# Patient Record
Sex: Female | Born: 1965 | Race: Black or African American | Hispanic: No | Smoking: Former smoker
Health system: Southern US, Community
[De-identification: ages and names within clinical notes are randomized; demographics above are authoritative.]

## PROBLEM LIST (undated history)

## (undated) DIAGNOSIS — I10 Essential (primary) hypertension: Secondary | ICD-10-CM

## (undated) DIAGNOSIS — C50919 Malignant neoplasm of unspecified site of unspecified female breast: Secondary | ICD-10-CM

## (undated) DIAGNOSIS — E119 Type 2 diabetes mellitus without complications: Secondary | ICD-10-CM

## (undated) HISTORY — PX: TONSILLECTOMY: SUR1361

## (undated) HISTORY — PX: TUBAL LIGATION: SHX77

---

## 2013-12-13 ENCOUNTER — Emergency Department (HOSPITAL_BASED_OUTPATIENT_CLINIC_OR_DEPARTMENT_OTHER): Payer: No Typology Code available for payment source

## 2013-12-13 ENCOUNTER — Emergency Department (HOSPITAL_BASED_OUTPATIENT_CLINIC_OR_DEPARTMENT_OTHER)
Admission: EM | Admit: 2013-12-13 | Discharge: 2013-12-13 | Disposition: A | Discharge status: Home / Self Care | Payer: No Typology Code available for payment source | Attending: Emergency Medicine | Admitting: Emergency Medicine

## 2013-12-13 ENCOUNTER — Encounter (HOSPITAL_BASED_OUTPATIENT_CLINIC_OR_DEPARTMENT_OTHER): Payer: Self-pay | Admitting: Emergency Medicine

## 2013-12-13 DIAGNOSIS — Z4800 Encounter for change or removal of nonsurgical wound dressing: Secondary | ICD-10-CM | POA: Diagnosis not present

## 2013-12-13 DIAGNOSIS — Z880 Allergy status to penicillin: Secondary | ICD-10-CM | POA: Insufficient documentation

## 2013-12-13 DIAGNOSIS — S81801A Unspecified open wound, right lower leg, initial encounter: Secondary | ICD-10-CM

## 2013-12-13 LAB — BASIC METABOLIC PANEL
Anion gap: 14 (ref 5–15)
BUN: 16 mg/dL (ref 6–23)
CO2: 23 meq/L (ref 19–32)
CREATININE: 0.7 mg/dL (ref 0.50–1.10)
Calcium: 9.7 mg/dL (ref 8.4–10.5)
Chloride: 100 mEq/L (ref 96–112)
GFR calc Af Amer: >90 mL/min (ref >90)
GLUCOSE: 107 mg/dL — AB (ref 70–99)
Potassium: 4.2 mEq/L (ref 3.7–5.3)
Sodium: 137 mEq/L (ref 137–147)

## 2013-12-13 LAB — CBC WITH DIFFERENTIAL/PLATELET
Basophils Absolute: 0 10*3/uL (ref 0.0–0.1)
Basophils Relative: 0 % (ref 0–1)
EOS ABS: 0.2 10*3/uL (ref 0.0–0.7)
Eosinophils Relative: 2 % (ref 0–5)
HEMATOCRIT: 39.1 % (ref 36.0–46.0)
Hemoglobin: 12.8 g/dL (ref 12.0–15.0)
LYMPHS PCT: 32 % (ref 12–46)
Lymphs Abs: 2.8 10*3/uL (ref 0.7–4.0)
MCH: 25.7 pg — AB (ref 26.0–34.0)
MCHC: 32.7 g/dL (ref 30.0–36.0)
MCV: 78.5 fL (ref 78.0–100.0)
MONO ABS: 0.9 10*3/uL (ref 0.1–1.0)
Monocytes Relative: 11 % (ref 3–12)
Neutro Abs: 4.9 10*3/uL (ref 1.7–7.7)
Neutrophils Relative %: 55 % (ref 43–77)
Platelets: 364 10*3/uL (ref 150–400)
RBC: 4.98 MIL/uL (ref 3.87–5.11)
RDW: 16 % — AB (ref 11.5–15.5)
WBC: 8.8 10*3/uL (ref 4.0–10.5)

## 2013-12-13 MED ORDER — CLINDAMYCIN PHOSPHATE 900 MG/50ML IV SOLN
900.0000 mg | Freq: Once | INTRAVENOUS | Status: AC
  Administered 2013-12-13: 900 mg via INTRAVENOUS
  Filled 2013-12-13: qty 50

## 2013-12-13 MED ORDER — HYDROMORPHONE HCL PF 1 MG/ML IJ SOLN
1.0000 mg | Freq: Once | INTRAMUSCULAR | Status: AC
  Administered 2013-12-13: 1 mg via INTRAVENOUS
  Filled 2013-12-13: qty 1

## 2013-12-13 MED ORDER — OXYCODONE-ACETAMINOPHEN 5-325 MG PO TABS: 1.0000 | ORAL_TABLET | ORAL | Status: AC | PRN

## 2013-12-13 MED ORDER — SODIUM CHLORIDE 0.9 % IV SOLN
Freq: Once | INTRAVENOUS | Status: AC
  Administered 2013-12-13: 10 mL/h via INTRAVENOUS

## 2013-12-13 NOTE — Discharge Instructions (Signed)

## 2013-12-13 NOTE — ED Provider Notes (Signed)
Medical screening examination/treatment/procedure(s) were performed by non-physician practitioner and as supervising physician I was immediately available for consultation/collaboration.   EKG Interpretation None       Orlie Dakin, MD 12/13/13 2317

## 2013-12-13 NOTE — ED Notes (Signed)
Pt C/o right lower  leg wound infection x 1 week ,, pt seen by PMD last week for same wound culture.

## 2013-12-13 NOTE — ED Provider Notes (Signed)
CSN: [id number]     Arrival date & time 12/13/13  1915 History   First MD Initiated Contact with Patient 12/13/13 1942     Chief Complaint  Patient presents with  . Wound Check     (Consider location/radiation/quality/duration/timing/severity/associated sxs/prior Treatment) Patient is a 48 y.o. female presenting with wound check. The history is provided by the patient. No language interpreter was used.  Wound Check Pertinent negatives include no chills, fever, nausea or vomiting. Associated symptoms comments: She presents for evaluation of wound to right lower leg that has been there for over one week. She was seen and treated by her doctor with an [place] and states cultures of the wound were reported as negative. No fever. She states the pain in the leg was much worse today prompting visit for "second opinion". She started taking Bactrim last week which was an old prescription, and stopped those today in favor of the Clindamycin Rx given to her by her doctor. Marland Kitchen    History reviewed. No pertinent past medical history. Past Surgical History  Procedure Laterality Date  . Tonsillectomy    . Tubal ligation     History reviewed. No pertinent family history. History  Substance Use Topics  . Smoking status: Former Research scientist (life sciences)  . Smokeless tobacco: Not on file  . Alcohol Use: No   OB History   Grav Para Term Preterm Abortions TAB SAB Ect Mult Living                 Review of Systems  Constitutional: Negative for fever and chills.  Gastrointestinal: Negative.  Negative for nausea and vomiting.  Musculoskeletal:       See HPI.  Skin: Positive for wound.  Neurological: Negative.       Allergies  Ampicillin  Home Medications   Prior to Admission medications   Not on File   BP 138/63  Pulse 85  Temp(Src) 98 F (36.7 C) (Oral)  Resp 16  Ht 5\' 11"  (1.803 m)  Wt 280 lb (127.007 kg)  BMI 39.07 kg/m2  SpO2 98% Physical Exam  Constitutional: She is oriented to person, place,  and time. She appears well-developed and well-nourished.  Neck: Normal range of motion.  Pulmonary/Chest: Effort normal.  Musculoskeletal:  Right lower extremity has a malodorous circular wound to medial distal leg with surrounding erythema that does not travel away from the wound.   Neurological: She is alert and oriented to person, place, and time.  Skin: Skin is warm and dry.    ED Course  Procedures (including critical care time) Labs Review Labs Reviewed  CBC WITH DIFFERENTIAL - Abnormal; Notable for the following:    MCH 25.7 (*)    RDW 16.0 (*)    All other components within normal limits  BASIC METABOLIC PANEL - Abnormal; Notable for the following:    Glucose, Bld 107 (*)    All other components within normal limits    Imaging Review Dg Tibia/fibula Right  12/13/2013   CLINICAL DATA:  RIGHT leg wound for 2 weeks. Wound infection. Increased pain. Wound medial and distal in the leg.  EXAM: RIGHT TIBIA AND FIBULA - 2 VIEW  COMPARISON:  None.  FINDINGS: Tibia and fibula appear within normal limits. Dystrophic calcifications are present in the subcutaneous fat in the medial distal leg. No gas is present in the soft tissues. The tibia and fibula show no areas of osteolysis to suggest osseous infection. Medial compartment osteoarthritis of the knee is incidentally noted.  IMPRESSION: No acute abnormality.   Electronically Signed   By: Dereck Ligas M.D.   On: 12/13/2013 20:36     EKG Interpretation None      MDM   Final diagnoses:  None    1. Lower extremity wound  Wound was cleaned and re-bandaged. No leukocytosis, fever or purulent drainage. Suspect odor was from aged bandage, not from wound infection. She reports cultures of the sore as negative and is on antibiotics prescribed by her PCP. Will provide pain management and refer back to PCP for further wound care.     Dewaine Oats, PA-C 12/13/13 2059

## 2013-12-13 NOTE — ED Notes (Signed)
PA at bedside.

## 2013-12-13 NOTE — ED Notes (Addendum)
Last wends wound was cultured by Dr. Benjamine Sprague and was supposed to give results today. NP said there was nothing in culture. Pt said last week wound was draining green discharge. Pt went to wound center and had it wrapped last Wednes. Pt states wound still smells and still hurting. Wants second opinion. Wound dressing removed; large wound draining yellowish discharge with red tissue exposed on left distal ankle.

## 2013-12-13 NOTE — ED Notes (Signed)
PT ambulated with baseline gait; VSS; A&Ox3; no signs of distress; respirations even and unlabored; skin warm and dry; no questions upon discharge.  

## 2013-12-13 NOTE — ED Notes (Signed)
PT's wound wrapped in nonstick dressing. Instructions provided. Pulses intact. No tingling reported.

## 2015-10-15 IMAGING — CR DG TIBIA/FIBULA 2V*R*
4 series · 4 of 4 positions shown · non-contrast
Comparison: None.

CLINICAL DATA: RIGHT leg wound for 2 weeks. Wound infection.
Increased pain. Wound medial and distal in the leg.

EXAM:
RIGHT TIBIA AND FIBULA - 2 VIEW

[t tib/fib ap right (1 of 2)]
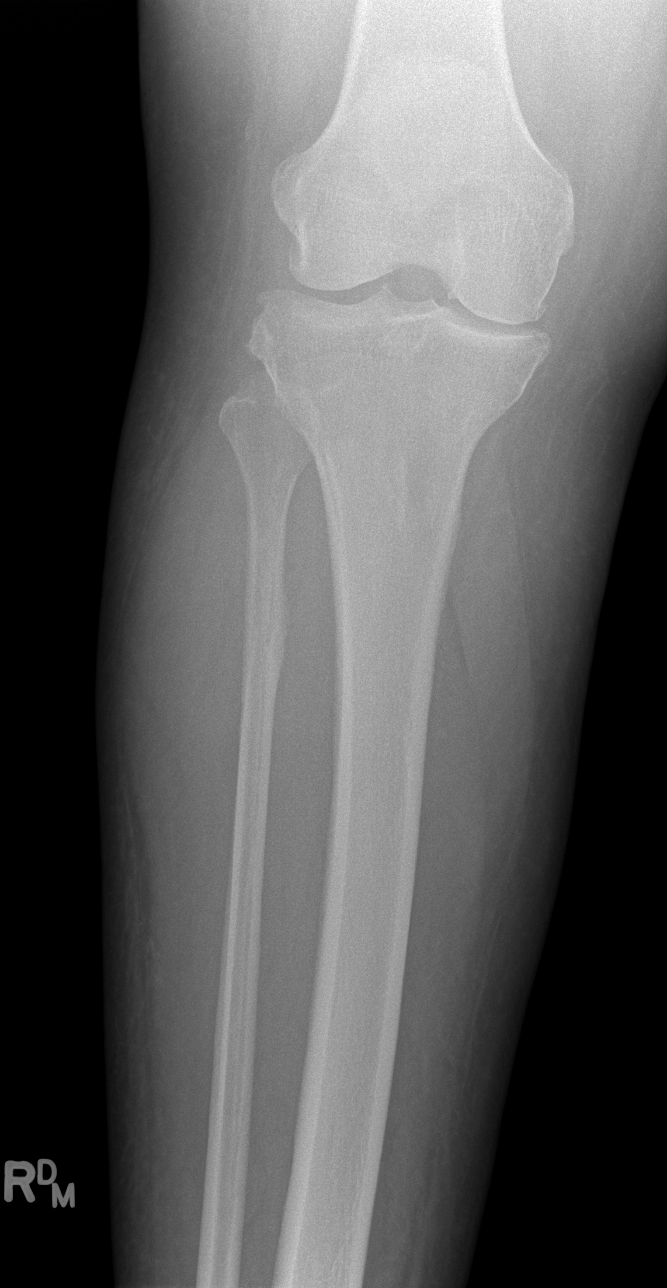

[t tib/fib ap right (2 of 2)]
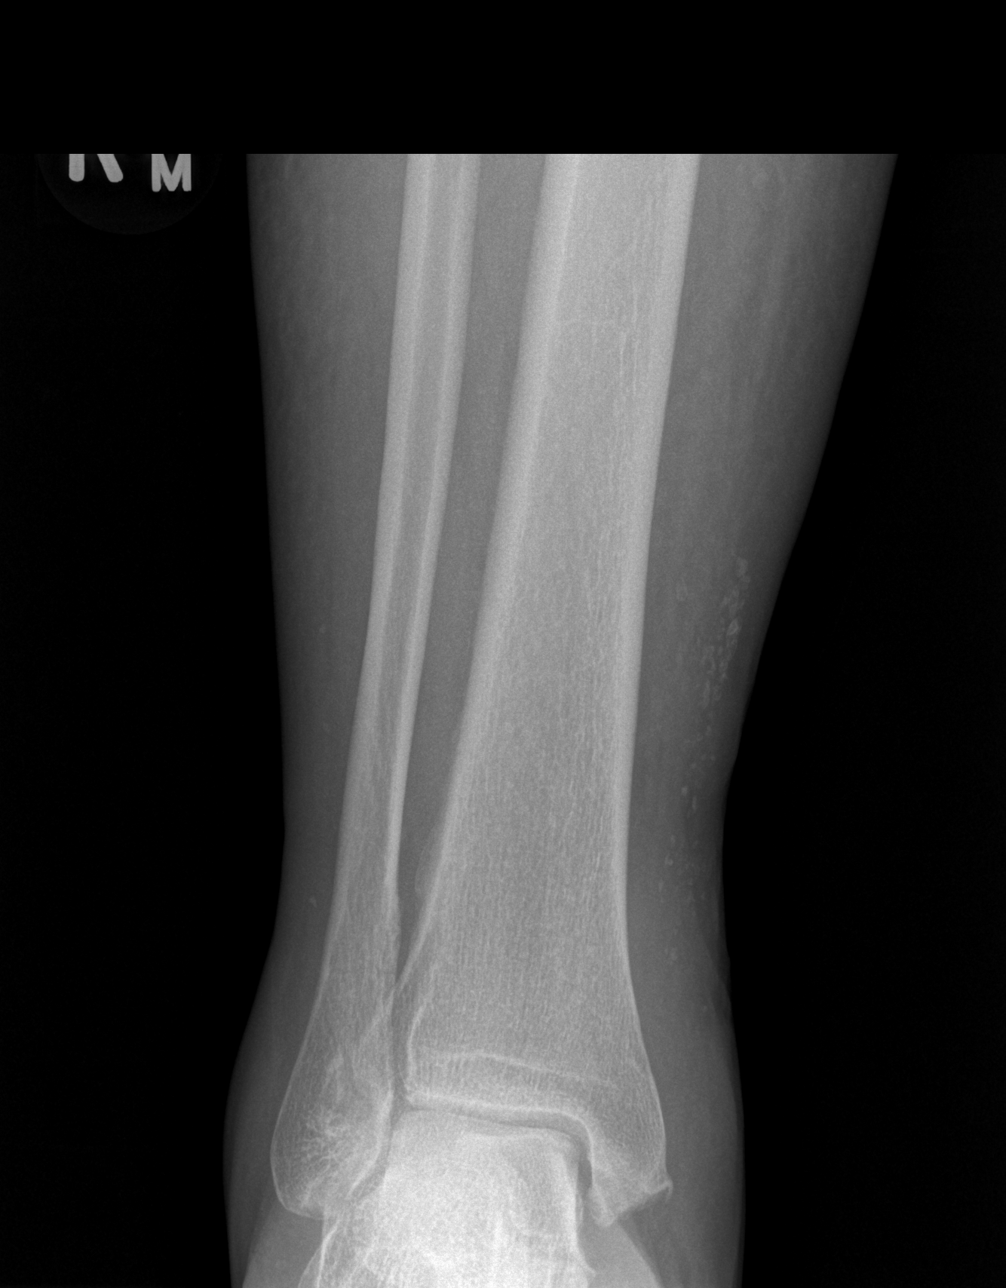

[t tib/fib lat right (1 of 2)]
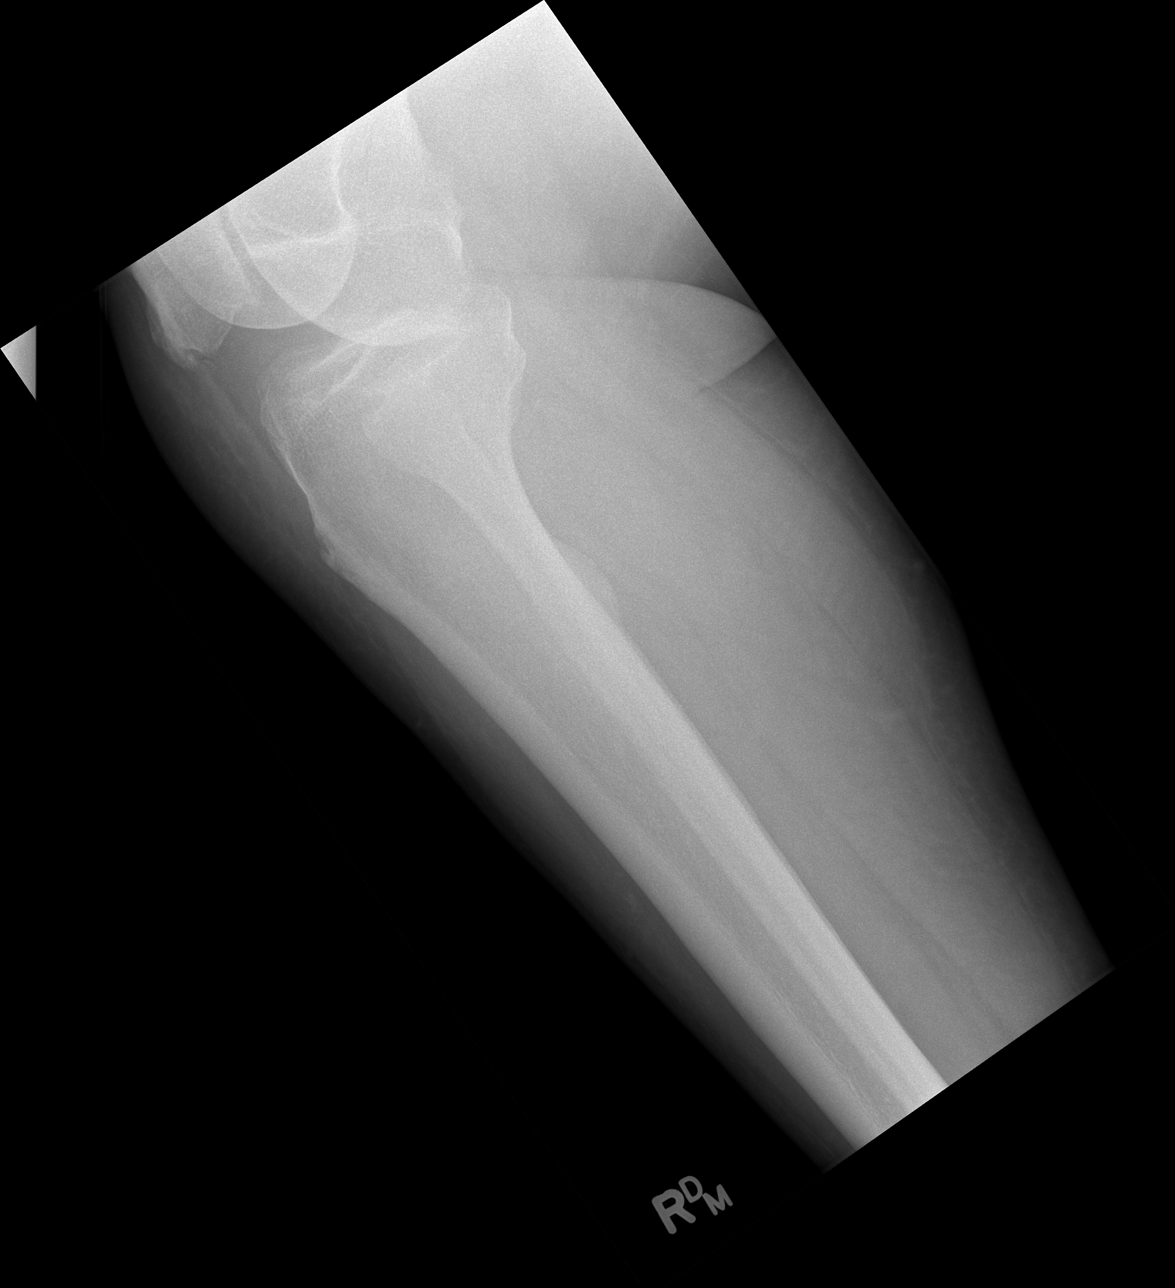

[t tib/fib lat right (2 of 2)]
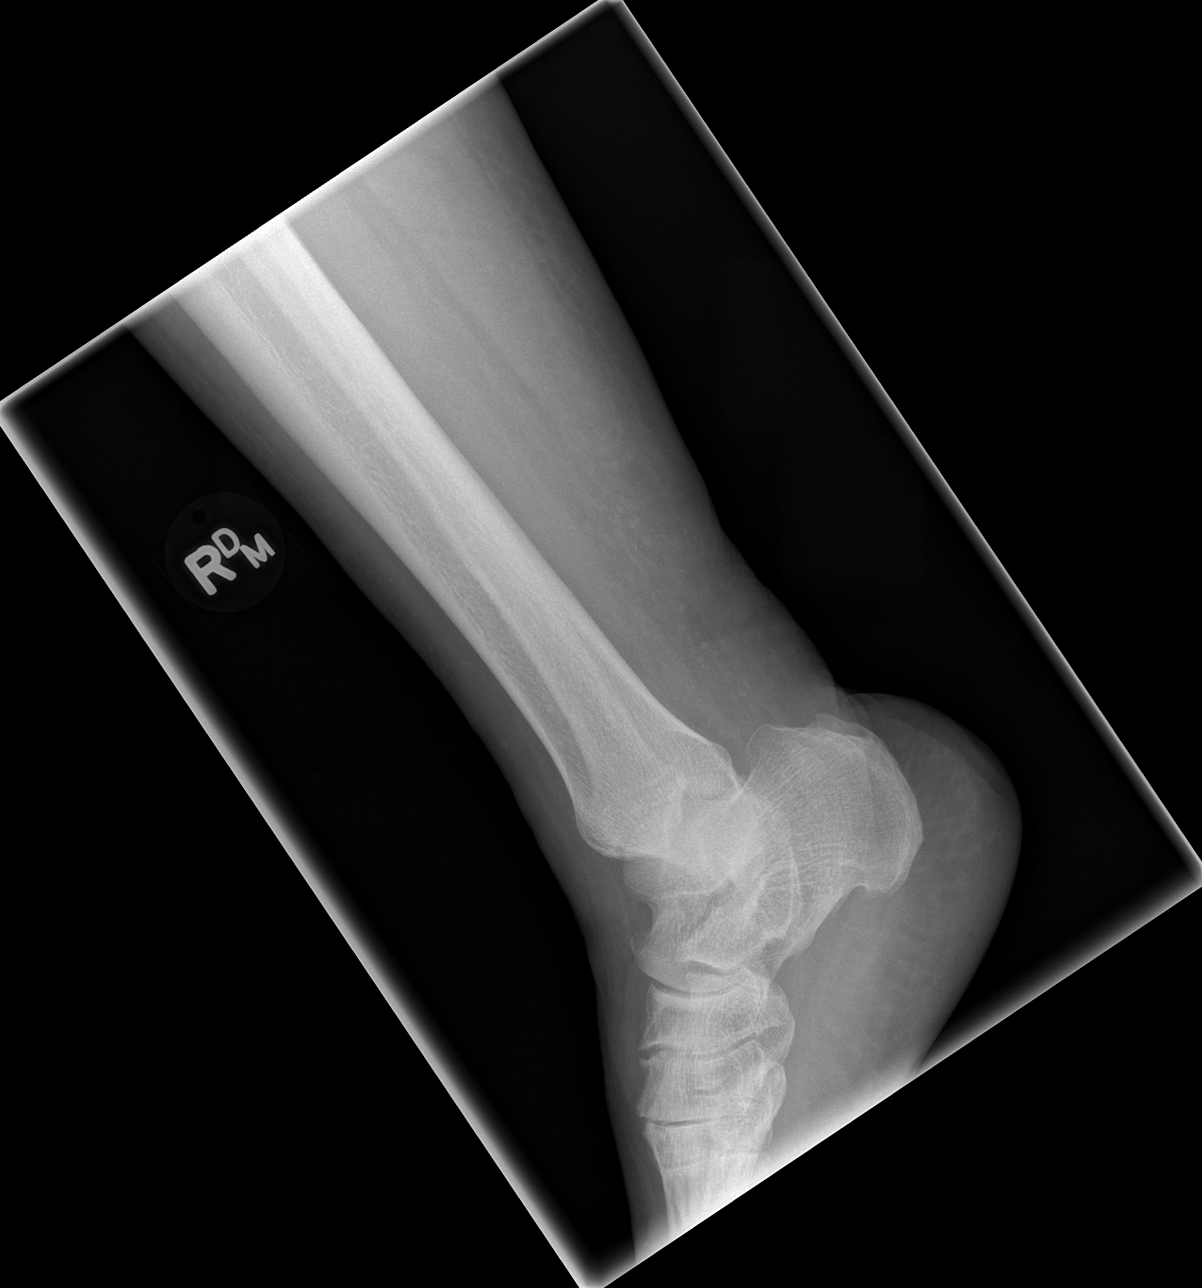

[4 of 4 positions shown; findings below may reference images not displayed]

FINDINGS: Tibia and fibula appear within normal limits. Dystrophic
calcifications are present in the subcutaneous fat in the medial
distal leg. No gas is present in the soft tissues. The tibia and
fibula show no areas of osteolysis to suggest osseous infection.
Medial compartment osteoarthritis of the knee is incidentally noted.
IMPRESSION: No acute abnormality.

## 2018-09-07 ENCOUNTER — Encounter (HOSPITAL_BASED_OUTPATIENT_CLINIC_OR_DEPARTMENT_OTHER): Payer: Self-pay | Admitting: Adult Health

## 2018-09-07 ENCOUNTER — Emergency Department (HOSPITAL_BASED_OUTPATIENT_CLINIC_OR_DEPARTMENT_OTHER)
Admission: EM | Admit: 2018-09-07 | Discharge: 2018-09-07 | Disposition: A | Discharge status: Home / Self Care | Payer: BLUE CROSS/BLUE SHIELD | Attending: Emergency Medicine | Admitting: Emergency Medicine

## 2018-09-07 ENCOUNTER — Other Ambulatory Visit: Payer: Self-pay

## 2018-09-07 DIAGNOSIS — R1013 Epigastric pain: Secondary | ICD-10-CM

## 2018-09-07 DIAGNOSIS — E119 Type 2 diabetes mellitus without complications: Secondary | ICD-10-CM | POA: Insufficient documentation

## 2018-09-07 DIAGNOSIS — Z7984 Long term (current) use of oral hypoglycemic drugs: Secondary | ICD-10-CM | POA: Diagnosis not present

## 2018-09-07 DIAGNOSIS — K219 Gastro-esophageal reflux disease without esophagitis: Secondary | ICD-10-CM | POA: Insufficient documentation

## 2018-09-07 DIAGNOSIS — Z853 Personal history of malignant neoplasm of breast: Secondary | ICD-10-CM | POA: Diagnosis not present

## 2018-09-07 DIAGNOSIS — I10 Essential (primary) hypertension: Secondary | ICD-10-CM | POA: Diagnosis not present

## 2018-09-07 DIAGNOSIS — Z87891 Personal history of nicotine dependence: Secondary | ICD-10-CM | POA: Diagnosis not present

## 2018-09-07 DIAGNOSIS — Z79899 Other long term (current) drug therapy: Secondary | ICD-10-CM | POA: Diagnosis not present

## 2018-09-07 DIAGNOSIS — Z7982 Long term (current) use of aspirin: Secondary | ICD-10-CM | POA: Diagnosis not present

## 2018-09-07 HISTORY — DX: Essential (primary) hypertension: I10

## 2018-09-07 HISTORY — DX: Malignant neoplasm of unspecified site of unspecified female breast: C50.919

## 2018-09-07 HISTORY — DX: Type 2 diabetes mellitus without complications: E11.9

## 2018-09-07 LAB — COMPREHENSIVE METABOLIC PANEL
ALT: 9 U/L (ref 0–44)
AST: 11 U/L — ABNORMAL LOW (ref 15–41)
Albumin: 3.4 g/dL — ABNORMAL LOW (ref 3.5–5.0)
Alkaline Phosphatase: 64 U/L (ref 38–126)
Anion gap: 8 (ref 5–15)
BUN: 11 mg/dL (ref 6–20)
CO2: 24 mmol/L (ref 22–32)
Calcium: 8.7 mg/dL — ABNORMAL LOW (ref 8.9–10.3)
Chloride: 106 mmol/L (ref 98–111)
Creatinine, Ser: 0.57 mg/dL (ref 0.44–1.00)
GFR calc Af Amer: >60 mL/min (ref >60)
GFR calc non Af Amer: >60 mL/min (ref >60)
Glucose, Bld: 130 mg/dL — ABNORMAL HIGH (ref 70–99)
Potassium: 3.6 mmol/L (ref 3.5–5.1)
Sodium: 138 mmol/L (ref 135–145)
Total Bilirubin: 0.5 mg/dL (ref 0.3–1.2)
Total Protein: 7.3 g/dL (ref 6.5–8.1)

## 2018-09-07 LAB — CBC WITH DIFFERENTIAL/PLATELET
Abs Immature Granulocytes: 0.05 10*3/uL (ref 0.00–0.07)
Basophils Absolute: 0 10*3/uL (ref 0.0–0.1)
Basophils Relative: 0 %
Eosinophils Absolute: 0.1 10*3/uL (ref 0.0–0.5)
Eosinophils Relative: 1 %
HCT: 34.7 % — ABNORMAL LOW (ref 36.0–46.0)
Hemoglobin: 10.6 g/dL — ABNORMAL LOW (ref 12.0–15.0)
Immature Granulocytes: 0 %
Lymphocytes Relative: 14 %
Lymphs Abs: 2 10*3/uL (ref 0.7–4.0)
MCH: 23.8 pg — ABNORMAL LOW (ref 26.0–34.0)
MCHC: 30.5 g/dL (ref 30.0–36.0)
MCV: 77.8 fL — ABNORMAL LOW (ref 80.0–100.0)
Monocytes Absolute: 0.8 10*3/uL (ref 0.1–1.0)
Monocytes Relative: 5 %
Neutro Abs: 11.9 10*3/uL — ABNORMAL HIGH (ref 1.7–7.7)
Neutrophils Relative %: 80 %
Platelets: 409 10*3/uL — ABNORMAL HIGH (ref 150–400)
RBC: 4.46 MIL/uL (ref 3.87–5.11)
RDW: 16.4 % — ABNORMAL HIGH (ref 11.5–15.5)
WBC: 14.9 10*3/uL — ABNORMAL HIGH (ref 4.0–10.5)
nRBC: 0 % (ref 0.0–0.2)

## 2018-09-07 LAB — LIPASE, BLOOD: Lipase: 43 U/L (ref 11–51)

## 2018-09-07 LAB — URINALYSIS, MICROSCOPIC (REFLEX)

## 2018-09-07 LAB — URINALYSIS, ROUTINE W REFLEX MICROSCOPIC
Bilirubin Urine: NEGATIVE
Glucose, UA: NEGATIVE mg/dL
Ketones, ur: NEGATIVE mg/dL
Leukocytes,Ua: NEGATIVE
Nitrite: NEGATIVE
Protein, ur: NEGATIVE mg/dL
Specific Gravity, Urine: 1.02 (ref 1.005–1.030)
pH: 6.5 (ref 5.0–8.0)

## 2018-09-07 MED ORDER — OMEPRAZOLE 20 MG PO CPDR: 20.0000 mg | DELAYED_RELEASE_CAPSULE | Freq: Every day | ORAL | 0 refills | Status: AC

## 2018-09-07 MED ORDER — ONDANSETRON HCL 4 MG/2ML IJ SOLN
4.0000 mg | Freq: Once | INTRAMUSCULAR | Status: AC
  Administered 2018-09-07: 11:00:00 4 mg via INTRAVENOUS
  Filled 2018-09-07: qty 2

## 2018-09-07 MED ORDER — ALUM & MAG HYDROXIDE-SIMETH 200-200-20 MG/5ML PO SUSP
30.0000 mL | Freq: Once | ORAL | Status: AC
  Administered 2018-09-07: 11:00:00 30 mL via ORAL
  Filled 2018-09-07: qty 30

## 2018-09-07 MED ORDER — LIDOCAINE VISCOUS HCL 2 % MT SOLN
15.0000 mL | Freq: Once | OROMUCOSAL | Status: AC
  Administered 2018-09-07: 15 mL via ORAL
  Filled 2018-09-07: qty 15

## 2018-09-07 NOTE — Discharge Instructions (Signed)
Avoid acidic foods and spicy foods. Thank you for allowing me to care for you today. Please return to the emergency department if you have new or worsening symptoms. Take your medications as instructed.

## 2018-09-07 NOTE — ED Triage Notes (Signed)
Presents with upper abdominal pain that began Tuesday but worsened this weekend, associated with nausea. She had a regular BM this AM

## 2018-09-07 NOTE — ED Provider Notes (Signed)
[facility] EMERGENCY DEPARTMENT Provider Note   CSN: [id number] Arrival date & time: 09/07/18  1004    History   Chief Complaint Chief Complaint  Patient presents with  . Abdominal Pain    HPI Michele Pham is a 53 y.o. female.     Patient is a 53 year old African-American female with past medical history of hypertension, diabetes who presents emergency department for stomach upset.  She reports that this is been intermittent since last Tuesday.  Reports that she felt some stomach upset on Tuesday but then felt better on Wednesday.  Reports that on Friday after she ate lasagna and hot wings she began to feel stomach pains in the epigastric region.  Reports that she has felt nauseated and with a "upset stomach" since then.  Reports that the pain is worse when she pushes on it.  Denies any vomiting, diarrhea, constipation.  She has not tried anything for relief.  She is concerned that she might have a stomach bug or might of gotten food poisoning from the hot wings that she ate.     Past Medical History:  Diagnosis Date  . Breast cancer ([place])   . Diabetes mellitus without complication ([place])   . Hypertension     There are no active problems to display for this patient.   Past Surgical History:  Procedure Laterality Date  . TONSILLECTOMY    . TUBAL LIGATION       OB History   No obstetric history on file.      Home Medications    Prior to Admission medications   Medication Sig Start Date End Date Taking? Authorizing Provider  aspirin EC 81 MG tablet Take by mouth. 06/17/16  Yes [provider]  atorvastatin (LIPITOR) 20 MG tablet Take by mouth. 06/17/16 06/22/22 Yes [provider]  glipiZIDE (GLUCOTROL XL) 10 MG 24 hr tablet Take by mouth. 06/16/17  Yes [provider]  metFORMIN (GLUCOPHAGE) 500 MG tablet Take by mouth. 06/17/16  Yes [provider]  JANUVIA 100 MG tablet TK ONE T PO D 07/30/18   [provider]   lisinopril (ZESTRIL) 10 MG tablet TK 1 T PO D 05/15/18   [provider]  omeprazole (PRILOSEC) 20 MG capsule Take 1 capsule (20 mg total) by mouth daily for 30 days. 09/07/18 10/07/18  Alveria Apley, PA-C  oxyCODONE-acetaminophen (PERCOCET/ROXICET) 5-325 MG per tablet Take 1-2 tablets by mouth every 4 (four) hours as needed for severe pain. 12/13/13   Charlann Lange, PA-C  tamoxifen (NOLVADEX) 20 MG tablet TK ONE T PO D 05/19/18   [provider]    Family History History reviewed. No pertinent family history.  Social History Social History   Tobacco Use  . Smoking status: Former Smoker  Substance Use Topics  . Alcohol use: No  . Drug use: Not on file     Allergies   Cefuroxime axetil; Penicillins; Nitrofurantoin; and Ampicillin   Review of Systems Review of Systems  Constitutional: Negative for chills and fever.  HENT: Negative for sore throat.   Respiratory: Negative for cough and shortness of breath.   Cardiovascular: Negative for chest pain and palpitations.  Gastrointestinal: Positive for abdominal pain and nausea. Negative for anal bleeding, blood in stool, constipation, diarrhea and vomiting.  Genitourinary: Negative for dysuria, hematuria, pelvic pain and vaginal bleeding.  Musculoskeletal: Negative for back pain and myalgias.  Skin: Negative for color change and rash.  Allergic/Immunologic: Negative for immunocompromised state.  Neurological: Negative for  syncope.  All other systems reviewed and are negative.    Physical Exam Updated Vital Signs BP 133/62 (BP Location: Right Arm)   Pulse 98   Temp 99.1 F (37.3 C) (Oral)   Resp 18   Ht '5\' 10"'  (1.778 m)   Wt 136.1 kg   SpO2 99%   BMI 43.05 kg/m   Physical Exam Vitals signs and nursing note reviewed.  Constitutional:      General: She is not in acute distress.    Appearance: She is well-developed. She is obese. She is not ill-appearing, toxic-appearing or diaphoretic.  HENT:     Head:  Normocephalic and atraumatic.     Mouth/Throat:     Mouth: Mucous membranes are moist.  Eyes:     Conjunctiva/sclera: Conjunctivae normal.  Neck:     Musculoskeletal: Neck supple.  Cardiovascular:     Rate and Rhythm: Normal rate and regular rhythm.     Heart sounds: No murmur.  Pulmonary:     Effort: Pulmonary effort is normal. No respiratory distress.     Breath sounds: Normal breath sounds.  Abdominal:     Palpations: Abdomen is soft.     Tenderness: There is abdominal tenderness in the epigastric area. There is no right CVA tenderness, left CVA tenderness, guarding or rebound. Negative signs include Murphy's sign, Rovsing's sign and McBurney's sign.  Skin:    General: Skin is warm and dry.  Neurological:     Mental Status: She is alert.      ED Treatments / Results  Labs (all labs ordered are listed, but only abnormal results are displayed) Labs Reviewed  CBC WITH DIFFERENTIAL/PLATELET - Abnormal; Notable for the following components:      Result Value   WBC 14.9 (*)    Hemoglobin 10.6 (*)    HCT 34.7 (*)    MCV 77.8 (*)    MCH 23.8 (*)    RDW 16.4 (*)    Platelets 409 (*)    Neutro Abs 11.9 (*)    All other components within normal limits  COMPREHENSIVE METABOLIC PANEL - Abnormal; Notable for the following components:   Glucose, Bld 130 (*)    Calcium 8.7 (*)    Albumin 3.4 (*)    AST 11 (*)    All other components within normal limits  URINALYSIS, ROUTINE W REFLEX MICROSCOPIC - Abnormal; Notable for the following components:   APPearance HAZY (*)    Hgb urine dipstick TRACE (*)    All other components within normal limits  URINALYSIS, MICROSCOPIC (REFLEX) - Abnormal; Notable for the following components:   Bacteria, UA MANY (*)    All other components within normal limits  URINE CULTURE  LIPASE, BLOOD    EKG None  Radiology No results found.  Procedures Procedures (including critical care time)  Medications Ordered in ED Medications  alum & mag  hydroxide-simeth (MAALOX/MYLANTA) 200-200-20 MG/5ML suspension 30 mL (30 mLs Oral Given 09/07/18 1056)    And  lidocaine (XYLOCAINE) 2 % viscous mouth solution 15 mL (15 mLs Oral Given 09/07/18 1056)  ondansetron (ZOFRAN) injection 4 mg (4 mg Intravenous Given 09/07/18 1055)     Initial Impression / Assessment and Plan / ED Course  I have reviewed the triage vital signs and the nursing notes.  Pertinent labs & imaging results that were available during my care of the patient were reviewed by me and considered in my medical decision making (see chart for details).  Clinical Course as of Sep 06 1140  Mon Sep 07, 2018  1047 I suspect GERD.  Will obtain labs.  We will give her zofran and a GI cocktail. Vitals normal.    [KM]  3953 Patient work-up shows no elevation in alk phos, AST or ALT's.  No signs of urinary tract infection.  She does not have peritoneal signs on her belly and she tolerated the p.o. medications.  No vomiting while here in the emergency department.  She did have some improvement of her symptoms with the GI cocktail but not entirely.  Still mildly tender.  I do not suspect a surgical belly.  More likely GERD.  I will start her on omeprazole and have her follow-up with her primary care doctor.  She was advised on strict return precautions.   [KM]    Clinical Course User Index [KM] Alveria Apley, PA-C       Based on review of vitals, medical screening exam, lab work and/or imaging, there does not appear to be an acute, emergent etiology for the patient's symptoms. Counseled pt on good return precautions and encouraged both PCP and ED follow-up as needed.  Prior to discharge, I also discussed incidental imaging findings with patient in detail and advised appropriate, recommended follow-up in detail.  Clinical Impression: 1. Epigastric pain   2. Gastroesophageal reflux disease, esophagitis presence not specified     Disposition: Discharge   This note was prepared with  assistance of Dragon voice recognition software. Occasional wrong-word or sound-a-like substitutions may have occurred due to the inherent limitations of voice recognition software.   Final Clinical Impressions(s) / ED Diagnoses   Final diagnoses:  Epigastric pain  Gastroesophageal reflux disease, esophagitis presence not specified    ED Discharge Orders         Ordered    omeprazole (PRILOSEC) 20 MG capsule  Daily     09/07/18 1137           Kristine Royal 09/07/18 1142    Blanchie Dessert, MD 09/07/18 1502

## 2018-09-08 LAB — URINE CULTURE: Culture: NO GROWTH
